# Patient Record
Sex: Male | Born: 1990 | Race: White | Hispanic: No | Marital: Single | Smoking: Former smoker
Health system: Southern US, Community
[De-identification: ages and names within clinical notes are randomized; demographics above are authoritative.]

## PROBLEM LIST (undated history)

## (undated) HISTORY — PX: OTHER SURGICAL HISTORY: SHX169

---

## 2012-05-18 ENCOUNTER — Ambulatory Visit: Payer: Self-pay | Admitting: Physician Assistant
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
## 2020-12-13 ENCOUNTER — Encounter: Payer: Self-pay | Admitting: Urology

## 2020-12-13 ENCOUNTER — Ambulatory Visit: Payer: Managed Care, Other (non HMO) | Admitting: Urology

## 2020-12-13 ENCOUNTER — Other Ambulatory Visit: Payer: Self-pay

## 2020-12-13 VITALS — BP 135/70 | HR 82 | Ht 73.0 in | Wt 280.0 lb

## 2020-12-13 DIAGNOSIS — N529 Male erectile dysfunction, unspecified: Secondary | ICD-10-CM

## 2020-12-13 DIAGNOSIS — R7989 Other specified abnormal findings of blood chemistry: Secondary | ICD-10-CM

## 2020-12-13 MED ORDER — TADALAFIL 5 MG PO TABS: 5.0000 mg | ORAL_TABLET | Freq: Every day | ORAL | 3 refills | Status: DC

## 2020-12-13 NOTE — Patient Instructions (Signed)
Tadalafil tablets (Cialis) What is this medicine? TADALAFIL (tah DA la fil) is used to treat erection problems in men. It is also used for enlargement of the prostate gland in men, a condition called benign prostatic hyperplasia or BPH. This medicine improves urine flow and reduces BPH symptoms. This medicine can also treat both erection problems and BPH when they occur together. This medicine may be used for other purposes; ask your health care provider or pharmacist if you have questions. COMMON BRAND NAME(S): Adcirca, ALYQ, Cialis What should I tell my health care provider before I take this medicine? They need to know if you have any of these conditions:  bleeding disorders  eye or vision problems, including a rare inherited eye disease called retinitis pigmentosa  anatomical deformation of the penis, Peyronie's disease, or history of priapism (painful and prolonged erection)  heart disease, angina, a history of heart attack, irregular heart beats, or other heart problems  high or low blood pressure  history of blood diseases, like sickle cell anemia or leukemia  history of stomach bleeding  kidney disease  liver disease  stroke  an unusual or allergic reaction to tadalafil, other medicines, foods, dyes, or preservatives  pregnant or trying to get pregnant  breast-feeding How should I use this medicine? Take this medicine by mouth with a glass of water. Follow the directions on the prescription label. You may take this medicine with or without meals. When this medicine is used for erection problems, your doctor may prescribe it to be taken once daily or as needed. If you are taking the medicine as needed, you may be able to have sexual activity 30 minutes after taking it and for up to 36 hours after taking it. Whether you are taking the medicine as needed or once daily, you should not take more than one dose per day. If you are taking this medicine for symptoms of benign  prostatic hyperplasia (BPH) or to treat both BPH and an erection problem, take the dose once daily at about the same time each day. Do not take your medicine more often than directed. Talk to your pediatrician regarding the use of this medicine in children. Special care may be needed. Overdosage: If you think you have taken too much of this medicine contact a poison control center or emergency room at once. NOTE: This medicine is only for you. Do not share this medicine with others. What if I miss a dose? If you are taking this medicine as needed for erection problems, this does not apply. If you miss a dose while taking this medicine once daily for an erection problem, benign prostatic hyperplasia, or both, take it as soon as you remember, but do not take more than one dose per day. What may interact with this medicine? Do not take this medicine with any of the following medications:  nitrates like amyl nitrite, isosorbide dinitrate, isosorbide mononitrate, nitroglycerin  other medicines for erectile dysfunction like avanafil, sildenafil, vardenafil  other tadalafil products (Adcirca)  riociguat This medicine may also interact with the following medications:  certain drugs for high blood pressure  certain drugs for the treatment of HIV infection or AIDS  certain drugs used for fungal or yeast infections, like fluconazole, itraconazole, ketoconazole, and voriconazole  certain drugs used for seizures like carbamazepine, phenytoin, and phenobarbital  grapefruit juice  macrolide antibiotics like clarithromycin, erythromycin, troleandomycin  medicines for prostate problems  rifabutin, rifampin or rifapentine This list may not describe all possible interactions. Give your   health care provider a list of all the medicines, herbs, non-prescription drugs, or dietary supplements you use. Also tell them if you smoke, drink alcohol, or use illegal drugs. Some items may interact with your  medicine. What should I watch for while using this medicine? If you notice any changes in your vision while taking this drug, call your doctor or health care professional as soon as possible. Stop using this medicine and call your health care provider right away if you have a loss of sight in one or both eyes. Contact your doctor or health care professional right away if the erection lasts longer than 4 hours or if it becomes painful. This may be a sign of serious problem and must be treated right away to prevent permanent damage. If you experience symptoms of nausea, dizziness, chest pain or arm pain upon initiation of sexual activity after taking this medicine, you should refrain from further activity and call your doctor or health care professional as soon as possible. Do not drink alcohol to excess (examples, 5 glasses of wine or 5 shots of whiskey) when taking this medicine. When taken in excess, alcohol can increase your chances of getting a headache or getting dizzy, increasing your heart rate or lowering your blood pressure. Using this medicine does not protect you or your partner against HIV infection (the virus that causes AIDS) or other sexually transmitted diseases. What side effects may I notice from receiving this medicine? Side effects that you should report to your doctor or health care professional as soon as possible:  allergic reactions like skin rash, itching or hives, swelling of the face, lips, or tongue  breathing problems  changes in hearing  changes in vision  chest pain  fast, irregular heartbeat  prolonged or painful erection  seizures Side effects that usually do not require medical attention (report to your doctor or health care professional if they continue or are bothersome):  back pain  dizziness  flushing  headache  indigestion  muscle aches  nausea  stuffy or runny nose This list may not describe all possible side effects. Call your doctor  for medical advice about side effects. You may report side effects to FDA at 1-800-FDA-1088. Where should I keep my medicine? Keep out of the reach of children. Store at room temperature between 15 and 30 degrees C (59 and 86 degrees F). Throw away any unused medicine after the expiration date. NOTE: This sheet is a summary. It may not cover all possible information. If you have questions about this medicine, talk to your doctor, pharmacist, or health care provider.  2021 Elsevier/Gold Standard (2014-03-06 13:15:49)     Testosterone Replacement Therapy Testosterone replacement therapy (TRT) treats men who have a low testosterone level. Testosterone is a male hormone that is produced in the testicles. It is responsible for typical male characteristics and for maintaining a man's sex drive and ability to get an erection. Testosterone also supports bone and muscle health. Low testosterone may not need to be treated. Your health care provider may recommend TRT if you have symptoms such as a low sex drive or erection problems, weak muscles or bones, or low energy. Types of TRT You and your health care provider will decide which form is best for you. TRT is available in the following forms:  Topical gels, creams, lotions, or sprays. Do not let other people, especially women or children, come in contact with the skin where the testosterone was applied.  Nasal gels.  Patches.  Pills.  Injections into the muscle or under the skin.  Long-acting pellets inserted under the skin. The amount of TRT you take and how long you take it is based on your condition. It is important to:  Begin TRT with the lowest possible dosage.  Stop TRT if your health care provider tells you to stop.  Work with your health care provider so that you feel informed and comfortable with your decision.   Tell a health care provider about:  Any allergies you have.  Any personal or family history of blood clots, breast  cancer, or prostate cancer.  If you have sleep apnea or have been told that you snore.  All medicines you are taking, including vitamins, herbs, eye drops, creams, and over-the-counter medicines.  Any surgeries you have had.  Any other medical conditions you have.  If you wish to have biological children someday. What are the benefits? Benefits of TRT vary but may include improved sexual function, muscle mass or strength, mood, or quality of life. What are the risks? Risks of TRT vary depending on your individual and medical history. Side effects can be related to the type of TRT you choose. If you choose products that are used on the skin, you may have skin irritation. If you get injections, you may have redness and swelling at the injection site. Side effects that can happen with any form of TRT include:  Lower sperm count.  Acne.  Swelling of your legs or feet, or tenderness in the chest or breast area.  Sleep disturbances and mood swings.  Enlarged prostate.  Increase in your red blood count. It is unclear if testosterone increases the risk of some serious conditions. Talk with your health care provider about your risk for these conditions, including:  Blood clots.  Heart disease, such as stroke or heart attack.  Prostate cancer. Risks of TRT may increase if you:  Have had or are at risk for prostate cancer or breast cancer.  Have had a previous stroke or heart attack.  Have a high number of red blood cells.  Have treated or untreated sleep apnea.  Have a large prostate. The long-term safety of TRT is not known. Follow these instructions at home:  Take over-the-counter and prescription medicines only as told by your health care provider.  Lose weight if you are overweight. Ask your health care provider to help you start a healthy diet and exercise program to reach and maintain a healthy weight.  Work with your health care provider to manage other medical  conditions that may lower your testosterone. These include obesity, high blood pressure, high cholesterol, diabetes, liver disease, kidney disease, and sleep apnea.  Do not use any testosterone replacement therapies that are not prescribed by your health care provider.  Keep all follow-up visits. This is important. Where to find more information  American Urological Foundation: www.urologyhealth.org  Endocrine Society: www.hormone.org Contact a health care provider if:  You have side effects from your TRT.  You have pain or swelling in your legs.  You have problems urinating.  You have lumps or changes in your breasts or armpits. Get help right away if:  You have shortness of breath.  You have chest pain.  You have slurred speech.  You have weakness or numbness in any part of your arms or legs. These symptoms may represent a serious problem that is an emergency. Do not wait to see if the symptoms will go away. Get medical help right away.  Call your local emergency services (911 in the [place] Do not drive yourself to the hospital. Summary  Testosterone replacement therapy (TRT) is used to treat men who have a low testosterone level.  TRT should only be prescribed by and under the supervision of a health care provider.  Tell a health care provider about any medical conditions you have.  TRT may have side effects.  Talk with your health care provider about all of the risks and benefits before you start therapy. This information is not intended to replace advice given to you by your health care provider. Make sure you discuss any questions you have with your health care provider. Document Revised: 08/10/2020 Document Reviewed: 08/10/2020 Elsevier Patient Education  2021 [employer].

## 2020-12-13 NOTE — Progress Notes (Signed)
   12/13/20 4:36 PM   Rod Can July 22, 1991 [id number]  CC: Erectile dysfunction, decreased libido  HPI: I saw Mr. Keith Graham and his wife in urology clinic today for the above issues.  He is a 30 year old recently married male who reports difficulty with erections as well as decreased libido.  He has never tried any medications for this previously.  He had a testosterone checked with his PCP on 11/05/2020 that was low at 283, however this was an afternoon testosterone value checked at 4:30 PM.  His history is notable for obesity with a BMI of 37.  He denies any urinary symptoms.  He is working to lose weight.  He is a non-smoker.  They are interested in starting a family.  PSA was normal at 0.3, urinalysis was benign, CMP and CBC unremarkable.   Surgical History: Past Surgical History:  Procedure Laterality Date  . none      Family History: Family History  Problem Relation Age of Onset  . Prostate cancer Neg Hx   . Bladder Cancer Neg Hx   . Kidney cancer Neg Hx     Social History:  reports that he has quit smoking. He has never used smokeless tobacco. No history on file for alcohol use and drug use.  Physical Exam: BP 135/70   Pulse 82   Ht 6\' 1"  (1.854 m)   Wt 280 lb (127 kg)   BMI 36.94 kg/m    Constitutional:  Alert and oriented, No acute distress.  Obese Cardiovascular: No clubbing, cyanosis, or edema. Respiratory: Normal respiratory effort, no increased work of breathing. 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  We discussed multifactorial causes of the symptoms including obesity and low testosterone.  We discussed the importance of checking a early morning  testosterone, and that a 4:30 PM testosterone that is mildly low is not sufficient for a diagnosis of low testosterone.  We also discussed that exogenous testosterone would not be a good option as they are considering starting a family, and this would likely cause infertility.  I briefly discussed the negative feedback loop of testosterone.  I recommended repeating a testosterone and LH in a morning lab, and will call with those results.  I also recommended a trial of Cialis 5 mg daily to see if this helps with his erections.  I strongly encouraged him to work on weight loss and exercise, as well as healthy eating and adequate sleep as these will help naturally improve his testosterone and erections.  Trial of Cialis for ED Repeat testosterone/LH labs in a.m. RTC 4 to 6 weeks symptom check   34, MD 12/13/2020  [facility] [address], [address] [place], [place] [phone]

## 2020-12-22 ENCOUNTER — Other Ambulatory Visit: Payer: Managed Care, Other (non HMO)

## 2020-12-22 ENCOUNTER — Other Ambulatory Visit: Payer: Self-pay

## 2020-12-22 DIAGNOSIS — R7989 Other specified abnormal findings of blood chemistry: Secondary | ICD-10-CM

## 2020-12-23 LAB — TESTOSTERONE: Testosterone: 167 ng/dL — ABNORMAL LOW (ref 264–916)

## 2020-12-23 LAB — LUTEINIZING HORMONE: LH: 3 m[IU]/mL (ref 1.7–8.6)

## 2021-01-12 ENCOUNTER — Ambulatory Visit: Payer: Managed Care, Other (non HMO) | Admitting: Urology

## 2021-01-12 ENCOUNTER — Encounter: Payer: Self-pay | Admitting: Urology

## 2021-01-12 ENCOUNTER — Other Ambulatory Visit: Payer: Self-pay

## 2021-01-12 VITALS — BP 123/77 | HR 62 | Ht 73.0 in | Wt 280.0 lb

## 2021-01-12 DIAGNOSIS — R7989 Other specified abnormal findings of blood chemistry: Secondary | ICD-10-CM | POA: Diagnosis not present

## 2021-01-12 DIAGNOSIS — N529 Male erectile dysfunction, unspecified: Secondary | ICD-10-CM

## 2021-01-12 MED ORDER — SILDENAFIL CITRATE 20 MG PO TABS: ORAL_TABLET | ORAL | 0 refills | Status: DC

## 2021-01-12 MED ORDER — CLOMIPHENE CITRATE 50 MG PO TABS: 25.0000 mg | ORAL_TABLET | Freq: Every day | ORAL | 5 refills | Status: DC

## 2021-01-12 NOTE — Progress Notes (Signed)
   01/12/2021 4:36 PM   Rod Can July 04, 1991 [id number]  Reason for visit: Follow up ED, low testosterone  HPI: I saw Mr. Keith Graham and his wife back in clinic for the above issues.  He is a 30 year old male with BMI of 4 who is married and interested in starting a family who has had trouble with decreased libido and erectile dysfunction.  At our last visit a a.m. testosterone was low at 167 and LH was normal at 3.0.  We also tried Cialis 5 mg daily for his ED.  He only took a few doses of this as he noticed bothersome headache.  He is interested in a trial of a different ED med.  We discussed options for his low testosterone in the setting of desire for maintaining fertility.  We again reviewed non-medication based strategies including weight loss, exercise, healthy eating, and adequate sleep.  I also recommended a trial of Clomid 25 mg daily to see if we can increase the testosterone and improve his symptoms of low libido and erectile dysfunction, as well as in improve his possible fertility.  Risks and benefits discussed.  Trial of sildenafil 20 to 60 mg on demand for ED, risks and benefits discussed Trial of Clomid 25 mg daily for low testosterone  Sondra Come, MD  [facility] [address] [place], [place] [address] [phone]

## 2021-02-21 ENCOUNTER — Other Ambulatory Visit: Payer: Self-pay

## 2021-02-21 ENCOUNTER — Other Ambulatory Visit: Payer: Managed Care, Other (non HMO)

## 2021-02-21 DIAGNOSIS — R7989 Other specified abnormal findings of blood chemistry: Secondary | ICD-10-CM

## 2021-02-22 LAB — TESTOSTERONE: Testosterone: 437 ng/dL (ref 264–916)

## 2021-02-24 ENCOUNTER — Encounter: Payer: Self-pay | Admitting: Urology

## 2021-02-24 ENCOUNTER — Other Ambulatory Visit: Payer: Self-pay

## 2021-02-24 ENCOUNTER — Ambulatory Visit (INDEPENDENT_AMBULATORY_CARE_PROVIDER_SITE_OTHER): Payer: Managed Care, Other (non HMO) | Admitting: Urology

## 2021-02-24 VITALS — BP 114/72 | HR 56 | Ht 74.0 in | Wt 269.0 lb

## 2021-02-24 DIAGNOSIS — N529 Male erectile dysfunction, unspecified: Secondary | ICD-10-CM

## 2021-02-24 DIAGNOSIS — R7989 Other specified abnormal findings of blood chemistry: Secondary | ICD-10-CM

## 2021-02-24 NOTE — Progress Notes (Signed)
   02/24/2021 4:07 PM   Rod Can 06-18-1991 [id number]  Reason for visit: Follow up low testosterone, ED  HPI: I saw Mr. Keith Graham and his wife back in clinic for the above issues.  He is a 30 year old male with obesity who is married and interested in starting a family who has had trouble with decreased libido and erectile dysfunction.  At our initial visit BMI was 37, and it has decreased to 34.5 with diet and exercise.  Testosterone was low at 167, and LH was normal at 3.0, and we started him on 25 mg Clomid daily.  He has had a excellent response with testosterone 437 on repeat 1 month later.  We also trialed both Cialis and sildenafil for his ED, but he has had worsening headaches with those medications and did not notice a difference in the erections after taking them for just a few days.  We discussed his options, and he is interested in a trial of the Cialis 2.5 mg daily.  We discussed this can be spaced to every other day, or even every third day if he is still getting headaches.  He reports a fair amount of anxiety and stress with work, and I think some depression as well based on our conversation and his wife's input today.  I recommended 6-week follow-up with a repeat a.m. testosterone as well as the morning estrogen, and to check on his libido and symptoms at that time.  If he is still having the symptoms despite normal testosterone, I think it may be worth being established with psychology or starting an antianxiety or antidepressant medication.  RTC 6 weeks with repeat AM testosterone and estrogen Continue Clomid, Cialis 2.5 mg daily   Sondra Come, MD  [facility] [address] [place], [place] [address] [phone]

## 2021-02-24 NOTE — Patient Instructions (Signed)
Take 2.5 mg of Cialis every day(half of a 5 mg tablet).  If you are still getting headaches, can space this to every other day, or even every third day to help with erections.  Continue to take the Clomid.  Continue exercising and healthy eating, as well as trying to get adequate sleep and reduce stress.  Follow-up in 6 weeks for repeat testosterone check and symptom check

## 2021-03-08 ENCOUNTER — Telehealth: Payer: Self-pay

## 2021-03-08 DIAGNOSIS — R7989 Other specified abnormal findings of blood chemistry: Secondary | ICD-10-CM

## 2021-03-08 DIAGNOSIS — N529 Male erectile dysfunction, unspecified: Secondary | ICD-10-CM

## 2021-03-08 NOTE — Telephone Encounter (Signed)
Incoming message on triage line in regards to patients clomid RX and cialis RX. Patient would like both RX's sent to [facility] since he is using [facility] and [facility] is no longer the cheapest. Please advise.

## 2021-03-09 MED ORDER — CLOMIPHENE CITRATE 50 MG PO TABS: 25.0000 mg | ORAL_TABLET | Freq: Every day | ORAL | 5 refills | Status: AC

## 2021-03-09 MED ORDER — TADALAFIL 2.5 MG PO TABS: 2.5000 mg | ORAL_TABLET | Freq: Every day | ORAL | 6 refills | Status: AC

## 2021-03-09 NOTE — Addendum Note (Signed)
Addended by: Frankey Shown on: 03/09/2021 09:27 AM   Modules accepted: Orders

## 2021-03-09 NOTE — Telephone Encounter (Signed)
Prescriptions sent to [facility].

## 2021-04-14 ENCOUNTER — Other Ambulatory Visit: Payer: Self-pay

## 2021-04-14 ENCOUNTER — Other Ambulatory Visit: Payer: Managed Care, Other (non HMO)

## 2021-04-14 DIAGNOSIS — R7989 Other specified abnormal findings of blood chemistry: Secondary | ICD-10-CM

## 2021-04-17 LAB — TESTOSTERONE,FREE AND TOTAL
Testosterone, Free: 16 pg/mL (ref 9.3–26.5)
Testosterone: 418 ng/dL (ref 264–916)

## 2021-04-17 LAB — ESTROGENS, TOTAL: Estrogen: 94 pg/mL (ref 56–213)

## 2021-04-21 ENCOUNTER — Other Ambulatory Visit: Payer: Self-pay

## 2021-04-21 ENCOUNTER — Ambulatory Visit: Payer: Managed Care, Other (non HMO) | Admitting: Urology

## 2021-04-21 ENCOUNTER — Encounter: Payer: Self-pay | Admitting: Urology

## 2021-04-21 VITALS — BP 134/71 | HR 84 | Ht 74.0 in | Wt 274.0 lb

## 2021-04-21 DIAGNOSIS — R6882 Decreased libido: Secondary | ICD-10-CM

## 2021-04-21 DIAGNOSIS — N529 Male erectile dysfunction, unspecified: Secondary | ICD-10-CM

## 2021-04-21 NOTE — Patient Instructions (Addendum)
[email or web address] Office Address: [address] [place], [place] [address] Phone: [phone]

## 2021-04-21 NOTE — Progress Notes (Signed)
   04/21/2021 3:51 PM   Keith Graham June 05, 1991 [id number]  Reason for visit: Follow up low libido, low testosterone, ED  HPI: I saw [name] and his wife back in clinic for the above issues.  He is a 30 year old male with obesity who is married and interested in starting a family who has had trouble with decreased libido and erectile dysfunction.  At our initial visit BMI was 37, and it has decreased to 34.5 with diet and exercise.  Testosterone was low at 167, and LH was normal at 3.0, and we started him on 25 mg Clomid daily.  He has had a excellent response with testosterone 437 on repeat 1 month later, and most recently 418 in June 2022.  Estrogen was normal at 94.  He also is having trouble with erections, but had severe side effect of headaches with 5 mg Cialis.  He is taking the 2.5 mg Cialis every other day and feels like this helps with the erections.  The primary issue continues to be decreased libido and complete loss of interest in sexual activity.  With his testosterone normalized without significant change, the erections improved with Cialis, I am not sure I have a good answer for his ongoing low libido.  I recommended meeting with a counselor and sex therapist, and information was provided.  I also recommended he reach out to his PCP to consider an antianxiety/antidepressant medication, as it sounds like depression may be contributing a fair amount to his lack of interest in sexual activity.  RTC 4 months Continue Clomid and low-dose Cialis   Sondra Come, MD  [facility] [address], [address] [place], [place] [address] [phone]

## 2021-08-24 ENCOUNTER — Ambulatory Visit: Payer: Self-pay | Admitting: Urology

## 2021-08-25 ENCOUNTER — Encounter: Payer: Self-pay | Admitting: Urology

## 2022-04-20 ENCOUNTER — Other Ambulatory Visit: Payer: Self-pay | Admitting: Neurology

## 2022-04-20 DIAGNOSIS — G43019 Migraine without aura, intractable, without status migrainosus: Secondary | ICD-10-CM

## 2022-05-07 ENCOUNTER — Ambulatory Visit
Admission: RE | Admit: 2022-05-07 | Discharge: 2022-05-07 | Disposition: A | Discharge status: Home / Self Care | Payer: Managed Care, Other (non HMO) | Source: Ambulatory Visit | Attending: Neurology | Admitting: Neurology

## 2022-05-07 DIAGNOSIS — G43019 Migraine without aura, intractable, without status migrainosus: Secondary | ICD-10-CM

## 2022-05-07 MED ORDER — GADOBENATE DIMEGLUMINE 529 MG/ML IV SOLN
20.0000 mL | Freq: Once | INTRAVENOUS | Status: AC | PRN
  Administered 2022-05-07: 20 mL via INTRAVENOUS

## 2022-05-08 ENCOUNTER — Telehealth: Payer: Self-pay

## 2022-05-08 NOTE — Telephone Encounter (Signed)
Should the patient see you or Dr. Madaline Brilliant? Patient has had a Brain MRI.

## 2022-05-08 NOTE — Telephone Encounter (Signed)
Just FYI.

## 2022-05-08 NOTE — Telephone Encounter (Signed)
Megan with Neurology has been notified.

## 2022-05-08 NOTE — Telephone Encounter (Signed)
-----   Message from Rockey Situ sent at 05/08/2022  8:58 AM EDT ----- Regarding: MRI Contact: [phone] Megan with Neurology called in an urgent referral for pituitary, recent MRI.

## 2022-05-09 ENCOUNTER — Other Ambulatory Visit: Payer: Self-pay | Admitting: Neurology

## 2022-05-09 DIAGNOSIS — E236 Other disorders of pituitary gland: Secondary | ICD-10-CM

## 2022-05-10 ENCOUNTER — Inpatient Hospital Stay: Admission: RE | Admit: 2022-05-10 | Payer: Managed Care, Other (non HMO) | Source: Ambulatory Visit

## 2022-05-12 ENCOUNTER — Ambulatory Visit
Admission: RE | Admit: 2022-05-12 | Discharge: 2022-05-12 | Disposition: A | Discharge status: Home / Self Care | Payer: Managed Care, Other (non HMO) | Source: Ambulatory Visit | Attending: Neurology | Admitting: Neurology

## 2022-05-12 DIAGNOSIS — E236 Other disorders of pituitary gland: Secondary | ICD-10-CM

## 2022-05-12 MED ORDER — GADOBENATE DIMEGLUMINE 529 MG/ML IV SOLN
10.0000 mL | Freq: Once | INTRAVENOUS | Status: AC | PRN
  Administered 2022-05-12: 10 mL via INTRAVENOUS

## 2022-05-13 ENCOUNTER — Other Ambulatory Visit: Payer: Managed Care, Other (non HMO)

## 2022-05-16 ENCOUNTER — Other Ambulatory Visit: Payer: Managed Care, Other (non HMO)

## 2024-11-16 ENCOUNTER — Other Ambulatory Visit: Payer: Self-pay

## 2024-11-16 ENCOUNTER — Emergency Department

## 2024-11-16 ENCOUNTER — Encounter: Payer: Self-pay | Admitting: *Deleted

## 2024-11-16 DIAGNOSIS — R079 Chest pain, unspecified: Secondary | ICD-10-CM | POA: Insufficient documentation

## 2024-11-16 DIAGNOSIS — I159 Secondary hypertension, unspecified: Secondary | ICD-10-CM | POA: Diagnosis not present

## 2024-11-16 LAB — BASIC METABOLIC PANEL WITH GFR
Anion gap: 15 (ref 5–15)
BUN: 15 mg/dL (ref 6–20)
CO2: 21 mmol/L — ABNORMAL LOW (ref 22–32)
Calcium: 9.3 mg/dL (ref 8.9–10.3)
Chloride: 105 mmol/L (ref 98–111)
Creatinine, Ser: 0.98 mg/dL (ref 0.61–1.24)
GFR, Estimated: >60 mL/min
Glucose, Bld: 98 mg/dL (ref 70–99)
Potassium: 4.7 mmol/L (ref 3.5–5.1)
Sodium: 141 mmol/L (ref 135–145)

## 2024-11-16 LAB — TROPONIN T, HIGH SENSITIVITY: Troponin T High Sensitivity: <15 ng/L (ref 0–19)

## 2024-11-16 LAB — CBC
HCT: 41.1 % (ref 39.0–52.0)
Hemoglobin: 12.9 g/dL — ABNORMAL LOW (ref 13.0–17.0)
MCH: 29.5 pg (ref 26.0–34.0)
MCHC: 31.4 g/dL (ref 30.0–36.0)
MCV: 93.8 fL (ref 80.0–100.0)
Platelets: 281 K/uL (ref 150–400)
RBC: 4.38 MIL/uL (ref 4.22–5.81)
RDW: 12.1 % (ref 11.5–15.5)
WBC: 8.6 K/uL (ref 4.0–10.5)
nRBC: 0 % (ref 0.0–0.2)

## 2024-11-16 NOTE — ED Triage Notes (Addendum)
 Pt ambulatory to triage.  Pt has chest pain/pressure for 1 day.  Pt reports intermittent sob.  No n/v/d  pt alert  speech clear. Hx WPW

## 2024-11-17 ENCOUNTER — Emergency Department: Admission: EM | Admit: 2024-11-17 | Discharge: 2024-11-17 | Disposition: A

## 2024-11-17 DIAGNOSIS — I159 Secondary hypertension, unspecified: Secondary | ICD-10-CM

## 2024-11-17 DIAGNOSIS — R079 Chest pain, unspecified: Secondary | ICD-10-CM

## 2024-11-17 NOTE — ED Notes (Signed)
 Pt given discharge paperwork and instructions. Pt verbalized understanding. Pt ambulated out of ED with a steady gait with family.

## 2024-11-17 NOTE — ED Provider Notes (Signed)
 "  [facility] Provider Note    Event Date/Time   First MD Initiated Contact with Patient 11/17/24 0005     (approximate)   History   Chest Pain   HPI  Keith Graham is a 34 y.o. male presenting with concern of chest pain.  Around noon this morning, developed sudden onset of chest pressure, had some radiation down his left arm with some numbness and tingling, seem to improved with exertion, unclear if it worsened with laying flat.  He states that he did recently travel to [place]  for work where he drank alcohol every day but denies any regular alcohol use.  States he has not smoked in about 10 years now.  No associated fevers chills cough no recent sick contacts, no new rashes.  No prior history of PEs DVTs has not had any extremity swelling, no recent surgeries no hemoptysis, no cancer history.  He does have a history of Wolff-Parkinson-White for which he had an ablation done and he follows with [facility]ology.     Physical Exam   Triage Vital Signs: ED Triage Vitals  Encounter Vitals Group     BP 11/16/24 2250 (!) 171/98     Girls Systolic BP Percentile --      Girls Diastolic BP Percentile --      Boys Systolic BP Percentile --      Boys Diastolic BP Percentile --      Pulse Rate 11/16/24 2250 70     Resp 11/16/24 2250 18     Temp 11/16/24 2250 98.4 F (36.9 C)     Temp Source 11/16/24 2250 Oral     SpO2 11/16/24 2250 99 %     Weight 11/16/24 2248 280 lb (127 kg)     Height 11/16/24 2248 6' 1 (1.854 m)     Head Circumference --      Peak Flow --      Pain Score 11/16/24 2248 2     Pain Loc --      Pain Education --      Exclude from Growth Chart --     Most recent vital signs: Vitals:   11/16/24 2250  BP: (!) 171/98  Pulse: 70  Resp: 18  Temp: 98.4 F (36.9 C)  SpO2: 99%     General: Awake, no distress.  CV:  Good peripheral perfusion.  No reproducible chest wall tenderness, no appreciated carotid bruits Resp:  Normal effort.  Able  to speak in full sentences, clear to auscultation bilaterally Abd:  No distention.  Soft nontender Other:     ED Results / Procedures / Treatments   Labs (all labs ordered are listed, but only abnormal results are displayed) Labs Reviewed  BASIC METABOLIC PANEL WITH GFR - Abnormal; Notable for the following components:      Result Value   CO2 21 (*)    All other components within normal limits  CBC - Abnormal; Notable for the following components:   Hemoglobin 12.9 (*)    All other components within normal limits  TROPONIN T, HIGH SENSITIVITY  TROPONIN T, HIGH SENSITIVITY     EKG  On my independent interpretation of this EKG appears to be a sinus rhythm with rate of about 75, axis of 15, intervals appear to be within normal limits, no obvious ischemia appreciated on this EKG   RADIOLOGY On my independent interpretation of this chest x-ray no acute cardiopulmonary findings  PROCEDURES:  Critical Care performed: No  Procedures  MEDICATIONS ORDERED IN ED: Medications - No data to display   IMPRESSION / MDM / ASSESSMENT AND PLAN / ED COURSE  I reviewed the triage vital signs and the nursing notes.                               Patient's presentation is most consistent with acute complicated illness / injury requiring diagnostic workup.  34 year old male history of Wolff-Parkinson-White presenting today with concern of chest pressure, currently resolved.  He appears well EKG is without ischemia troponin is negative here, he is hypertensive, but remaining vitals appear reassuring.  He is negative by [facility].  Given his presentation, he did recently drink much more throughout the week so possibly gastritis or reflux, but also considering possibly ACS which clinically I feel is unlikely.  Given the reassuring lab work, encouraged follow-up with his own cardiologist which he will call in the morning.  I discussed return precautions, he verbalized understanding and is agreeable  with the plan.       FINAL CLINICAL IMPRESSION(S) / ED DIAGNOSES   Final diagnoses:  Chest pain, unspecified type  Secondary hypertension     Rx / DC Orders   ED Discharge Orders     None        Note:  This document was prepared using Dragon voice recognition software and may include unintentional dictation errors.   Fernand Rossie HERO, MD 11/17/24 [phone]  "

## 2024-11-17 NOTE — Discharge Instructions (Signed)
 You were seen today due to concern of chest pain.  I would strongly encourage you to follow-up with your cardiologist to have this further evaluated and worked up.  If you have any worsening symptoms such as increased chest pain, shortness of breath, lightheadedness, or any other symptoms you find concerning please return to the emergency department immediately for further medical management.  Your blood pressure today was elevated, please be sure to discuss with your cardiologist in regards to having is better controlled and managed.
